# Patient Record
Sex: Female | Born: 1980 | Hispanic: No | Marital: Single | Smoking: Current every day smoker
Health system: Southern US, Community
[De-identification: ages and names within clinical notes are randomized; demographics above are authoritative.]

## PROBLEM LIST (undated history)

## (undated) DIAGNOSIS — F32A Depression, unspecified: Secondary | ICD-10-CM

## (undated) DIAGNOSIS — F329 Major depressive disorder, single episode, unspecified: Secondary | ICD-10-CM

## (undated) DIAGNOSIS — K52832 Lymphocytic colitis: Secondary | ICD-10-CM

## (undated) DIAGNOSIS — F419 Anxiety disorder, unspecified: Secondary | ICD-10-CM

## (undated) DIAGNOSIS — M419 Scoliosis, unspecified: Secondary | ICD-10-CM

## (undated) HISTORY — DX: Scoliosis, unspecified: M41.9

## (undated) HISTORY — DX: Lymphocytic colitis: K52.832

## (undated) HISTORY — DX: Anxiety disorder, unspecified: F41.9

## (undated) HISTORY — DX: Depression, unspecified: F32.A

---
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
## 2000-06-11 ENCOUNTER — Other Ambulatory Visit: Admission: RE | Admit: 2000-06-11 | Discharge: 2000-06-11 | Payer: Self-pay | Admitting: *Deleted

## 2018-12-10 ENCOUNTER — Emergency Department (HOSPITAL_COMMUNITY): Payer: No Typology Code available for payment source

## 2018-12-10 ENCOUNTER — Emergency Department (HOSPITAL_COMMUNITY)
Admission: EM | Admit: 2018-12-10 | Discharge: 2018-12-10 | Disposition: A | Discharge status: Home / Self Care | Payer: No Typology Code available for payment source | Attending: Emergency Medicine | Admitting: Emergency Medicine

## 2018-12-10 ENCOUNTER — Encounter (HOSPITAL_COMMUNITY): Payer: Self-pay | Admitting: Emergency Medicine

## 2018-12-10 ENCOUNTER — Other Ambulatory Visit: Payer: Self-pay

## 2018-12-10 DIAGNOSIS — S6991XA Unspecified injury of right wrist, hand and finger(s), initial encounter: Secondary | ICD-10-CM | POA: Diagnosis present

## 2018-12-10 DIAGNOSIS — R0789 Other chest pain: Secondary | ICD-10-CM | POA: Insufficient documentation

## 2018-12-10 DIAGNOSIS — S301XXA Contusion of abdominal wall, initial encounter: Secondary | ICD-10-CM | POA: Insufficient documentation

## 2018-12-10 DIAGNOSIS — Y9389 Activity, other specified: Secondary | ICD-10-CM | POA: Diagnosis not present

## 2018-12-10 DIAGNOSIS — R0781 Pleurodynia: Secondary | ICD-10-CM | POA: Diagnosis not present

## 2018-12-10 DIAGNOSIS — S60222A Contusion of left hand, initial encounter: Secondary | ICD-10-CM | POA: Diagnosis not present

## 2018-12-10 DIAGNOSIS — M545 Low back pain: Secondary | ICD-10-CM | POA: Insufficient documentation

## 2018-12-10 DIAGNOSIS — S52571A Other intraarticular fracture of lower end of right radius, initial encounter for closed fracture: Secondary | ICD-10-CM | POA: Diagnosis not present

## 2018-12-10 DIAGNOSIS — Y999 Unspecified external cause status: Secondary | ICD-10-CM | POA: Diagnosis not present

## 2018-12-10 DIAGNOSIS — S5012XA Contusion of left forearm, initial encounter: Secondary | ICD-10-CM | POA: Insufficient documentation

## 2018-12-10 DIAGNOSIS — S80212A Abrasion, left knee, initial encounter: Secondary | ICD-10-CM | POA: Diagnosis not present

## 2018-12-10 DIAGNOSIS — T07XXXA Unspecified multiple injuries, initial encounter: Secondary | ICD-10-CM

## 2018-12-10 DIAGNOSIS — Y92410 Unspecified street and highway as the place of occurrence of the external cause: Secondary | ICD-10-CM | POA: Diagnosis not present

## 2018-12-10 LAB — COMPREHENSIVE METABOLIC PANEL
ALT: 30 U/L (ref 0–44)
AST: 22 U/L (ref 15–41)
Albumin: 4.8 g/dL (ref 3.5–5.0)
Alkaline Phosphatase: 75 U/L (ref 38–126)
Anion gap: 8 (ref 5–15)
BUN: 11 mg/dL (ref 6–20)
CO2: 25 mmol/L (ref 22–32)
Calcium: 9.5 mg/dL (ref 8.9–10.3)
Chloride: 105 mmol/L (ref 98–111)
Creatinine, Ser: 0.76 mg/dL (ref 0.44–1.00)
GFR calc Af Amer: >60 mL/min (ref >60)
GFR calc non Af Amer: >60 mL/min (ref >60)
Glucose, Bld: 122 mg/dL — ABNORMAL HIGH (ref 70–99)
Potassium: 3.5 mmol/L (ref 3.5–5.1)
Sodium: 138 mmol/L (ref 135–145)
TOTAL PROTEIN: 7.6 g/dL (ref 6.5–8.1)
Total Bilirubin: 0.4 mg/dL (ref 0.3–1.2)

## 2018-12-10 LAB — CBC
HEMATOCRIT: 43.7 % (ref 36.0–46.0)
HEMOGLOBIN: 14.1 g/dL (ref 12.0–15.0)
MCH: 32.5 pg (ref 26.0–34.0)
MCHC: 32.3 g/dL (ref 30.0–36.0)
MCV: 100.7 fL — ABNORMAL HIGH (ref 80.0–100.0)
Platelets: 239 10*3/uL (ref 150–400)
RBC: 4.34 MIL/uL (ref 3.87–5.11)
RDW: 11.9 % (ref 11.5–15.5)
WBC: 10.9 10*3/uL — ABNORMAL HIGH (ref 4.0–10.5)
nRBC: 0 % (ref 0.0–0.2)

## 2018-12-10 LAB — I-STAT BETA HCG BLOOD, ED (MC, WL, AP ONLY)

## 2018-12-10 MED ORDER — MORPHINE SULFATE (PF) 4 MG/ML IV SOLN
4.0000 mg | Freq: Once | INTRAVENOUS | Status: AC
  Administered 2018-12-10: 4 mg via INTRAVENOUS
  Filled 2018-12-10: qty 1

## 2018-12-10 MED ORDER — NAPROXEN 500 MG PO TABS: 500.0000 mg | ORAL_TABLET | Freq: Two times a day (BID) | ORAL | 0 refills | Status: DC | PRN

## 2018-12-10 MED ORDER — SODIUM CHLORIDE (PF) 0.9 % IJ SOLN
INTRAMUSCULAR | Status: AC
  Filled 2018-12-10: qty 50

## 2018-12-10 MED ORDER — HYDROCODONE-ACETAMINOPHEN 5-325 MG PO TABS: 1.0000 | ORAL_TABLET | Freq: Four times a day (QID) | ORAL | 0 refills | Status: DC | PRN

## 2018-12-10 MED ORDER — IOPAMIDOL (ISOVUE-300) INJECTION 61%
INTRAVENOUS | Status: AC
  Filled 2018-12-10: qty 100

## 2018-12-10 MED ORDER — IOPAMIDOL (ISOVUE-300) INJECTION 61%
100.0000 mL | Freq: Once | INTRAVENOUS | Status: AC | PRN
  Administered 2018-12-10: 100 mL via INTRAVENOUS

## 2018-12-10 MED ORDER — TETANUS-DIPHTH-ACELL PERTUSSIS 5-2.5-18.5 LF-MCG/0.5 IM SUSP: 0.5000 mL | Freq: Once | INTRAMUSCULAR | Status: DC

## 2018-12-10 MED ORDER — IOHEXOL 300 MG/ML  SOLN: 100.0000 mL | Freq: Once | INTRAMUSCULAR | Status: DC | PRN

## 2018-12-10 MED ORDER — CYCLOBENZAPRINE HCL 10 MG PO TABS: 10.0000 mg | ORAL_TABLET | Freq: Three times a day (TID) | ORAL | 0 refills | Status: AC | PRN
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
## 2019-11-01 ENCOUNTER — Telehealth: Payer: Self-pay | Admitting: Gastroenterology

## 2019-11-01 NOTE — Telephone Encounter (Signed)
[facility] faxed request for medical records to [facility] 11/01/19  [place]

## 2019-11-02 ENCOUNTER — Encounter: Payer: Self-pay | Admitting: Gastroenterology

## 2019-12-06 ENCOUNTER — Other Ambulatory Visit: Payer: Self-pay

## 2019-12-06 ENCOUNTER — Ambulatory Visit: Payer: Self-pay | Admitting: Gastroenterology

## 2019-12-06 ENCOUNTER — Other Ambulatory Visit (INDEPENDENT_AMBULATORY_CARE_PROVIDER_SITE_OTHER): Payer: Self-pay

## 2019-12-06 ENCOUNTER — Encounter: Payer: Self-pay | Admitting: Gastroenterology

## 2019-12-06 VITALS — BP 110/70 | HR 85 | Temp 98.3°F | Ht 70.0 in | Wt 188.0 lb

## 2019-12-06 DIAGNOSIS — K529 Noninfective gastroenteritis and colitis, unspecified: Secondary | ICD-10-CM

## 2019-12-06 DIAGNOSIS — Z01818 Encounter for other preprocedural examination: Secondary | ICD-10-CM

## 2019-12-06 DIAGNOSIS — R109 Unspecified abdominal pain: Secondary | ICD-10-CM

## 2019-12-06 LAB — TSH: TSH: 3.9 u[IU]/mL (ref 0.35–4.50)

## 2019-12-06 LAB — IGA: IgA: 149 mg/dL (ref 68–378)

## 2019-12-06 LAB — CBC WITH DIFFERENTIAL/PLATELET
Basophils Absolute: 0 10*3/uL (ref 0.0–0.1)
Basophils Relative: 0.2 % (ref 0.0–3.0)
Eosinophils Absolute: 0.3 10*3/uL (ref 0.0–0.7)
Eosinophils Relative: 2.9 % (ref 0.0–5.0)
HCT: 42.5 % (ref 36.0–46.0)
Hemoglobin: 14.3 g/dL (ref 12.0–15.0)
Lymphocytes Relative: 22.2 % (ref 12.0–46.0)
Lymphs Abs: 1.9 10*3/uL (ref 0.7–4.0)
MCHC: 33.5 g/dL (ref 30.0–36.0)
MCV: 98.9 fl (ref 78.0–100.0)
Monocytes Absolute: 0.5 10*3/uL (ref 0.1–1.0)
Monocytes Relative: 6.1 % (ref 3.0–12.0)
Neutro Abs: 6 10*3/uL (ref 1.4–7.7)
Neutrophils Relative %: 68.6 % (ref 43.0–77.0)
Platelets: 223 10*3/uL (ref 150.0–400.0)
RBC: 4.3 Mil/uL (ref 3.87–5.11)
RDW: 12.1 % (ref 11.5–15.5)
WBC: 8.7 10*3/uL (ref 4.0–10.5)

## 2019-12-06 MED ORDER — PLENVU 140 G PO SOLR: 1.0000 | Freq: Once | ORAL | 0 refills | Status: AC

## 2019-12-06 MED ORDER — DICYCLOMINE HCL 10 MG PO CAPS: ORAL_CAPSULE | ORAL | 3 refills | Status: AC
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
## 2019-12-06 NOTE — Patient Instructions (Addendum)
If you are age 41 or older, your body mass index should be between 23-30. Your Body mass index is 26.98 kg/m. If this is out of the aforementioned range listed, please consider follow up with your Primary Care Provider.  If you are age 42 or younger, your body mass index should be between 19-25. Your Body mass index is 26.98 kg/m. If this is out of the aformentioned range listed, please consider follow up with your Primary Care Provider.   Please go to the lab in the basement of our building to have lab work done as you leave today. Hit "B" for basement when you get on the elevator.  When the doors open the lab is on your left.  We will call you with the results. Thank you.  Due to recent changes in healthcare laws, you may see the results of your imaging and laboratory studies on MyChart before your provider has had a chance to review them.  We understand that in some cases there may be results that are confusing or concerning to you. Not all laboratory results come back in the same time frame and the provider may be waiting for multiple results in order to interpret others.  Please give [place] 48 hours in order for your provider to thoroughly review all the results before contacting the office for clarification of your results.   We have sent the following medications to your pharmacy for you to pick up at your convenience: START Bentyl 10 mg: Take 1-2 tablet every 8 hours as needed   STOP taking Naproxen  STOP taking NSAIDs and use Tylenol as needed.  Thank you for entrusting me with your care and for choosing [facility], Dr. [place] Cellar

## 2019-12-08 LAB — SARS CORONAVIRUS 2 (TAT 6-24 HRS): SARS Coronavirus 2: NEGATIVE

## 2019-12-09 ENCOUNTER — Ambulatory Visit (AMBULATORY_SURGERY_CENTER): Payer: Self-pay | Admitting: Gastroenterology

## 2019-12-09 ENCOUNTER — Encounter: Payer: Self-pay | Admitting: Gastroenterology

## 2019-12-09 ENCOUNTER — Other Ambulatory Visit: Payer: Self-pay

## 2019-12-09 VITALS — BP 93/62 | HR 62 | Temp 96.9°F | Resp 12 | Ht 70.0 in | Wt 188.0 lb

## 2019-12-09 DIAGNOSIS — K52832 Lymphocytic colitis: Secondary | ICD-10-CM

## 2019-12-09 DIAGNOSIS — D127 Benign neoplasm of rectosigmoid junction: Secondary | ICD-10-CM

## 2019-12-09 DIAGNOSIS — K635 Polyp of colon: Secondary | ICD-10-CM

## 2019-12-09 DIAGNOSIS — K529 Noninfective gastroenteritis and colitis, unspecified: Secondary | ICD-10-CM

## 2019-12-09 LAB — TISSUE TRANSGLUTAMINASE ABS,IGG,IGA
(tTG) Ab, IgA: 1 U/mL
(tTG) Ab, IgG: 1 U/mL

## 2019-12-09 MED ORDER — SODIUM CHLORIDE 0.9 % IV SOLN: 500.0000 mL | Freq: Once | INTRAVENOUS | Status: DC

## 2019-12-09 NOTE — Op Note (Signed)
[place] Patient Name: Michelle Brock Procedure Date: 12/09/2019 1:24 PM MRN: [id number] Endoscopist: Remo Lipps P. Havery Moros , MD Age: 39 Referring MD:  Date of Birth: 01/06/1981 Gender: Female Account #: [id number] Procedure:                Colonoscopy Indications:              Chronic diarrhea / abdominal cramps Medicines:                Monitored Anesthesia Care Procedure:                Pre-Anesthesia Assessment:                           - Prior to the procedure, a History and Physical                            was performed, and patient medications and                            allergies were reviewed. The patient's tolerance of                            previous anesthesia was also reviewed. The risks                            and benefits of the procedure and the sedation                            options and risks were discussed with the patient.                            All questions were answered, and informed consent                            was obtained. Prior Anticoagulants: The patient has                            taken no previous anticoagulant or antiplatelet                            agents. ASA Grade Assessment: II - A patient with                            mild systemic disease. After reviewing the risks                            and benefits, the patient was deemed in                            satisfactory condition to undergo the procedure.                           After obtaining informed consent, the colonoscope  was passed under direct vision. Throughout the                            procedure, the patient's blood pressure, pulse, and                            oxygen saturations were monitored continuously. The                            Colonoscope was introduced through the anus and                            advanced to the the terminal ileum, with                            identification of the  appendiceal orifice and IC                            valve. The colonoscopy was performed without                            difficulty. The patient tolerated the procedure                            well. The quality of the bowel preparation was                            good. The terminal ileum, ileocecal valve,                            appendiceal orifice, and rectum were photographed. Scope In: 1:29:57 PM Scope Out: 1:53:24 PM Scope Withdrawal Time: 0 hours 18 minutes 56 seconds  Total Procedure Duration: 0 hours 23 minutes 27 seconds  Findings:                 The perianal and digital rectal examinations were                            normal.                           The terminal ileum appeared normal.                           Two sessile polyps were found in the recto-sigmoid                            colon. The polyps were 4 to 5 mm in size. These                            polyps were removed with a cold snare. Resection                            and retrieval were complete.  Internal hemorrhoids were found during                            retroflexion. The hemorrhoids were small.                           The exam was otherwise without abnormality.                           Biopsies for histology were taken with a cold                            forceps from the right colon, left colon and                            transverse colon for evaluation of microscopic                            colitis. Complications:            No immediate complications. Estimated blood loss:                            Minimal. Estimated Blood Loss:     Estimated blood loss was minimal. Impression:               - The examined portion of the ileum was normal.                           - Two 4 to 5 mm polyps at the recto-sigmoid colon,                            removed with a cold snare. Resected and retrieved.                           - Internal hemorrhoids.                            - The examination was otherwise normal.                           - Biopsies were taken with a cold forceps from the                            right colon, left colon and transverse colon for                            evaluation of microscopic colitis. Recommendation:           - Patient has a contact number available for                            emergencies. The signs and symptoms of potential                            delayed complications were  discussed with the                            patient. Return to normal activities tomorrow.                            Written discharge instructions were provided to the                            patient.                           - Resume previous diet.                           - Continue present medications.                           - Await pathology results. Remo Lipps P. Shantara Goosby, MD 12/09/2019 1:57:41 PM This report has been signed electronically.

## 2019-12-09 NOTE — Progress Notes (Signed)
Temp by YF, Vital by DT

## 2019-12-09 NOTE — Patient Instructions (Signed)
Please, read all of the handouts given to you by your recovery room nurse.  YOU HAD AN ENDOSCOPIC PROCEDURE TODAY AT [facility]:   Refer to the procedure report that was given to you for any specific questions about what was found during the examination.  If the procedure report does not answer your questions, please call your gastroenterologist to clarify.  If you requested that your care partner not be given the details of your procedure findings, then the procedure report has been included in a sealed envelope for you to review at your convenience later.  YOU SHOULD EXPECT: Some feelings of bloating in the abdomen. Passage of more gas than usual.  Walking can help get rid of the air that was put into your GI tract during the procedure and reduce the bloating. If you had a lower endoscopy (such as a colonoscopy or flexible sigmoidoscopy) you may notice spotting of blood in your stool or on the toilet paper. If you underwent a bowel prep for your procedure, you may not have a normal bowel movement for a few days.  Please Note:  You might notice some irritation and congestion in your nose or some drainage.  This is from the oxygen used during your procedure.  There is no need for concern and it should clear up in a day or so.  SYMPTOMS TO REPORT IMMEDIATELY:   Following lower endoscopy (colonoscopy or flexible sigmoidoscopy):  Excessive amounts of blood in the stool  Significant tenderness or worsening of abdominal pains  Swelling of the abdomen that is new, acute  Fever of 100F or higher  For urgent or emergent issues, a gastroenterologist can be reached at any hour by calling [phone].   DIET:  We do recommend a small meal at first, but then you may proceed to your regular diet.  Drink plenty of fluids but you should avoid alcoholic beverages for 24 hours.  ACTIVITY:  You should plan to take it easy for the rest of today and you should NOT DRIVE or use heavy machinery  until tomorrow (because of the sedation medicines used during the test).    FOLLOW UP: Our staff will call the number listed on your records 48-72 hours following your procedure to check on you and address any questions or concerns that you may have regarding the information given to you following your procedure. If we do not reach you, we will leave a message.  We will attempt to reach you two times.  During this call, we will ask if you have developed any symptoms of COVID 19. If you develop any symptoms (ie: fever, flu-like symptoms, shortness of breath, cough etc.) before then, please call [phone].  If you test positive for Covid 19 in the 2 weeks post procedure, please call and report this information to [place].    If any biopsies were taken you will be contacted by phone or by letter within the next 1-3 weeks.  Please call us at [phone] if you have not heard about the biopsies in 3 weeks.    SIGNATURES/CONFIDENTIALITY: You and/or your care partner have signed paperwork which will be entered into your electronic medical record.  These signatures attest to the fact that that the information above on your After Visit Summary has been reviewed and is understood.  Full responsibility of the confidentiality of this discharge information lies with you and/or your care-partner.

## 2019-12-09 NOTE — Progress Notes (Signed)
A and O x3. Report to RN. Tolerated MAC anesthesia well.

## 2019-12-09 NOTE — Progress Notes (Signed)
Called to room to assist during endoscopic procedure.  Patient ID and intended procedure confirmed with present staff. Received instructions for my participation in the procedure from the performing physician.  

## 2019-12-12 ENCOUNTER — Telehealth: Payer: Self-pay | Admitting: *Deleted

## 2019-12-12 ENCOUNTER — Telehealth: Payer: Self-pay

## 2019-12-12 NOTE — Telephone Encounter (Signed)
No answer, left message to call back later today, B.Dalisa Forrer RN. 

## 2019-12-12 NOTE — Telephone Encounter (Signed)
No answer for second post procedure follow up call. Left message for patient to call back with questions or concerns.

## 2019-12-14 ENCOUNTER — Other Ambulatory Visit: Payer: Self-pay

## 2019-12-14 ENCOUNTER — Telehealth: Payer: Self-pay

## 2019-12-14 MED ORDER — BUDESONIDE 3 MG PO CPEP: 9.0000 mg | ORAL_CAPSULE | Freq: Every day | ORAL | 0 refills | Status: AC

## 2019-12-14 NOTE — Telephone Encounter (Signed)
Pt returned your call.  

## 2019-12-14 NOTE — Telephone Encounter (Signed)
Left message for patient to please call back. 

## 2019-12-15 NOTE — Telephone Encounter (Signed)
Called patient back and got voice  Mail. Left message to please call back again

## 2020-02-09 ENCOUNTER — Encounter: Payer: Self-pay | Admitting: Gastroenterology

## 2020-02-09 ENCOUNTER — Ambulatory Visit (INDEPENDENT_AMBULATORY_CARE_PROVIDER_SITE_OTHER): Payer: Self-pay | Admitting: Gastroenterology

## 2020-02-09 VITALS — BP 120/66 | HR 77 | Temp 97.9°F | Ht 69.0 in | Wt 191.1 lb

## 2020-02-09 DIAGNOSIS — K52832 Lymphocytic colitis: Secondary | ICD-10-CM
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
## 2020-02-09 NOTE — Patient Instructions (Signed)
Thank you for entrusting me with your care and for choosing [facility], Dr. [place] Cellar
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
